# Patient Record
Sex: Female | Born: 1990 | Race: Black or African American | Hispanic: No | Marital: Single | Smoking: Current every day smoker
Health system: Southern US, Community
[De-identification: ages and names within clinical notes are randomized; demographics above are authoritative.]

---

## 2012-06-04 ENCOUNTER — Emergency Department (HOSPITAL_COMMUNITY): Payer: No Typology Code available for payment source

## 2012-06-04 ENCOUNTER — Emergency Department (HOSPITAL_COMMUNITY)
Admission: EM | Admit: 2012-06-04 | Discharge: 2012-06-04 | Disposition: A | Discharge status: Home / Self Care | Payer: No Typology Code available for payment source | Attending: Emergency Medicine | Admitting: Emergency Medicine

## 2012-06-04 DIAGNOSIS — Y92410 Unspecified street and highway as the place of occurrence of the external cause: Secondary | ICD-10-CM | POA: Insufficient documentation

## 2012-06-04 DIAGNOSIS — S46909A Unspecified injury of unspecified muscle, fascia and tendon at shoulder and upper arm level, unspecified arm, initial encounter: Secondary | ICD-10-CM | POA: Insufficient documentation

## 2012-06-04 DIAGNOSIS — Y9389 Activity, other specified: Secondary | ICD-10-CM | POA: Insufficient documentation

## 2012-06-04 DIAGNOSIS — IMO0002 Reserved for concepts with insufficient information to code with codable children: Secondary | ICD-10-CM | POA: Insufficient documentation

## 2012-06-04 MED ORDER — IBUPROFEN 800 MG PO TABS: 800.0000 mg | ORAL_TABLET | Freq: Three times a day (TID) | ORAL | Status: DC

## 2012-06-04 MED ORDER — CYCLOBENZAPRINE HCL 5 MG PO TABS: 5.0000 mg | ORAL_TABLET | Freq: Two times a day (BID) | ORAL | Status: DC | PRN
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
## 2012-06-08 NOTE — ED Provider Notes (Signed)
Medical screening examination/treatment/procedure(s) were performed by non-physician practitioner and as supervising physician I was immediately available for consultation/collaboration.  Christopher J. Pollina, MD 06/08/12 2339 

## 2012-10-18 ENCOUNTER — Emergency Department (HOSPITAL_COMMUNITY): Payer: Self-pay

## 2012-10-18 ENCOUNTER — Encounter (HOSPITAL_COMMUNITY): Payer: Self-pay | Admitting: *Deleted

## 2012-10-18 ENCOUNTER — Emergency Department (HOSPITAL_COMMUNITY)
Admission: EM | Admit: 2012-10-18 | Discharge: 2012-10-18 | Disposition: A | Discharge status: Home / Self Care | Payer: Self-pay | Attending: Emergency Medicine | Admitting: Emergency Medicine

## 2012-10-18 DIAGNOSIS — Z3202 Encounter for pregnancy test, result negative: Secondary | ICD-10-CM | POA: Insufficient documentation

## 2012-10-18 DIAGNOSIS — F17200 Nicotine dependence, unspecified, uncomplicated: Secondary | ICD-10-CM | POA: Insufficient documentation

## 2012-10-18 DIAGNOSIS — M25562 Pain in left knee: Secondary | ICD-10-CM

## 2012-10-18 DIAGNOSIS — M25569 Pain in unspecified knee: Secondary | ICD-10-CM | POA: Insufficient documentation

## 2012-10-18 DIAGNOSIS — Z9181 History of falling: Secondary | ICD-10-CM | POA: Insufficient documentation

## 2012-10-18 LAB — URINALYSIS, ROUTINE W REFLEX MICROSCOPIC
Bilirubin Urine: NEGATIVE
Glucose, UA: NEGATIVE mg/dL
Hgb urine dipstick: NEGATIVE
Protein, ur: NEGATIVE mg/dL
Specific Gravity, Urine: 1.026 (ref 1.005–1.030)
Urobilinogen, UA: 1 mg/dL (ref 0.0–1.0)

## 2012-10-18 LAB — URINE MICROSCOPIC-ADD ON

## 2012-10-18 MED ORDER — OXYCODONE HCL 5 MG PO TABS
5.0000 mg | ORAL_TABLET | Freq: Once | ORAL | Status: AC
  Administered 2012-10-18: 5 mg via ORAL
  Filled 2012-10-18: qty 1

## 2012-10-18 NOTE — ED Provider Notes (Signed)
CSN: [id number]     Arrival date & time 10/18/12  1955 History     First MD Initiated Contact with Patient 10/18/12 2014     Chief Complaint  Patient presents with  . Knee Pain   (Consider location/radiation/quality/duration/timing/severity/associated sxs/prior Treatment) HPI Comments: Patient presents emergency department with chief complaint of left-sided knee pain x2 weeks. She she states that she thinks she has "arthritis." She states that she slipped and fell about 2 months ago and has had intermittent pain since the fall. She is able to ambulate. She states the pain is moderate. Additionally, she is complaining of a foul odor to her urine, but no hematuria or dysuria. She denies any other pain or symptoms.  The history is provided by the patient. No language interpreter was used.    History reviewed. No pertinent past medical history. History reviewed. No pertinent past surgical history. No family history on file. History  Substance Use Topics  . Smoking status: Current Every Day Smoker  . Smokeless tobacco: Not on file  . Alcohol Use: Yes   OB History   Grav Para Term Preterm Abortions TAB SAB Ect Mult Living                 Review of Systems  All other systems reviewed and are negative.    Allergies  Tramadol and Tylenol  Home Medications   Current Outpatient Rx  Name  Route  Sig  Dispense  Refill  . ibuprofen (ADVIL,MOTRIN) 800 MG tablet   Oral   Take 1 tablet (800 mg total) by mouth 3 (three) times daily.   21 tablet   0    BP 138/71  Pulse 86  Temp(Src) 98.9 F (37.2 C)  Resp 18  SpO2 99%  LMP 09/24/2012 Physical Exam  Nursing note and vitals reviewed. Constitutional: She is oriented to person, place, and time. She appears well-developed and well-nourished.  HENT:  Head: Normocephalic and atraumatic.  Eyes: Conjunctivae and EOM are normal. Pupils are equal, round, and reactive to light.  Neck: Normal range of motion. Neck supple.   Cardiovascular: Normal rate and regular rhythm.  Exam reveals no gallop and no friction rub.   No murmur heard. Pulmonary/Chest: Effort normal and breath sounds normal. No respiratory distress. She has no wheezes. She has no rales. She exhibits no tenderness.  Abdominal: Soft. Bowel sounds are normal. She exhibits no distension and no mass. There is no tenderness. There is no rebound and no guarding.  Musculoskeletal: Normal range of motion. She exhibits no edema and no tenderness.  Left-sided knee tenderness to palpation, no bony abnormality or deformity, no swelling,  Neurological: She is alert and oriented to person, place, and time.  Skin: Skin is warm and dry.  Psychiatric: She has a normal mood and affect. Her behavior is normal. Judgment and thought content normal.    ED Course   Procedures (including critical care time)  Labs Reviewed  URINALYSIS, ROUTINE W REFLEX MICROSCOPIC - Abnormal; Notable for the following:    APPearance CLOUDY (*)    Leukocytes, UA MODERATE (*)    All other components within normal limits  URINE MICROSCOPIC-ADD ON - Abnormal; Notable for the following:    Squamous Epithelial / LPF FEW (*)    Bacteria, UA FEW (*)    All other components within normal limits  URINE CULTURE  PREGNANCY, URINE   Dg Knee Complete 4 Views Left  10/18/2012   *RADIOLOGY REPORT*  Clinical Data: Left knee pain  for 2 weeks.  No injury.  LEFT KNEE - COMPLETE 4+ VIEW  Comparison: None.  Findings: Anatomic alignment of the left knee.  There is no fracture.  Medial and lateral joint spaces are preserved.  No effusion.  IMPRESSION: Negative.   Original Report Authenticated By: Andreas Newport, M.D.   1. Knee pain, left     MDM  Patient with knee pain, but negative plain films. Will give knee sleeve, and recommend orthopedic followup. Urinalysis is clean. Recommend hydration, and cranberry juice. She denies any dysuria or hematuria.  Roxy Horseman, PA-C 10/18/12 2337

## 2012-10-18 NOTE — ED Notes (Signed)
The pt has pain in her lt knee for 2 weeks.  She also wants her urine checked because it has a strong odor.  lmp  July 1st

## 2012-10-19 NOTE — ED Provider Notes (Signed)
Medical screening examination/treatment/procedure(s) were performed by non-physician practitioner and as supervising physician I was immediately available for consultation/collaboration.  Derwood Kaplan, MD 10/19/12 4098

## 2012-10-21 LAB — URINE CULTURE

## 2012-10-22 ENCOUNTER — Telehealth (HOSPITAL_COMMUNITY): Payer: Self-pay | Admitting: *Deleted

## 2012-10-22 NOTE — ED Notes (Signed)
Post ED Visit - Positive Culture Follow-up: Successful Patient Follow-Up  Culture assessed and recommendations reviewed by: []  Wes Dulaney, Pharm.D., BCPS []  Celedonio Miyamoto, Pharm.D., BCPS []  Georgina Pillion, Pharm.D., BCPS []  Star, [address].D., BCPS, AAHIVP [x]  Estella Husk, Pharm.D., BCPS, AAHIVP  Positive Urine culture  []  Patient discharged without antimicrobial prescription and treatment is now indicated [x]  Organism is resistant to prescribed ED discharge antimicrobial []  Patient with positive blood cultures  Changes discussed with ED provider:Josh Geiple New antibiotic prescription Keflex 500 mg TID x 7 days    Larena Sox 10/22/2012, 11:09 AM

## 2012-10-22 NOTE — Progress Notes (Addendum)
ED Antimicrobial Stewardship Positive Culture Follow Up   Joanne Price is an 22 y.o. female who presented to [facility] on 10/18/2012 with a chief complaint of knee pain and malodorus urine.  Chief Complaint  Patient presents with  . Knee Pain    Recent Results (from the past 720 hour(s))  URINE CULTURE     Status: None   Collection Time    10/18/12  8:13 PM      Result Value Range Status   Specimen Description URINE, CLEAN CATCH   Final   Special Requests NONE   Final   Culture  Setup Time 10/19/2012 02:18   Final   Colony Count >=100,000 COLONIES/ML   Final   Culture ESCHERICHIA COLI   Final   Report Status 10/21/2012 FINAL   Final   Organism ID, Bacteria ESCHERICHIA COLI   Final    [x]  Patient discharged originally without antimicrobial agent and treatment is now indicated  New antibiotic prescription: Keflex 500mg  PO TID x 7 days  ED Provider: Rhea Bleacher, PA-C  Sallee Provencal 10/22/2012, 10:22 AM Infectious Diseases Pharmacist Phone# [phone]

## 2012-10-24 ENCOUNTER — Telehealth (HOSPITAL_COMMUNITY): Payer: Self-pay | Admitting: Emergency Medicine

## 2012-10-25 ENCOUNTER — Telehealth (HOSPITAL_COMMUNITY): Payer: Self-pay | Admitting: Emergency Medicine

## 2012-10-25 NOTE — ED Notes (Signed)
Unable to contact patient via phone. Sent letter. °

## 2013-01-05 ENCOUNTER — Emergency Department (HOSPITAL_COMMUNITY)
Admission: EM | Admit: 2013-01-05 | Discharge: 2013-01-05 | Disposition: A | Discharge status: Home / Self Care | Payer: Self-pay | Attending: Emergency Medicine | Admitting: Emergency Medicine

## 2013-01-05 ENCOUNTER — Encounter (HOSPITAL_COMMUNITY): Payer: Self-pay | Admitting: Emergency Medicine

## 2013-01-05 DIAGNOSIS — Z888 Allergy status to other drugs, medicaments and biological substances status: Secondary | ICD-10-CM | POA: Insufficient documentation

## 2013-01-05 DIAGNOSIS — F17200 Nicotine dependence, unspecified, uncomplicated: Secondary | ICD-10-CM | POA: Insufficient documentation

## 2013-01-05 DIAGNOSIS — N898 Other specified noninflammatory disorders of vagina: Secondary | ICD-10-CM | POA: Insufficient documentation

## 2013-01-05 DIAGNOSIS — K089 Disorder of teeth and supporting structures, unspecified: Secondary | ICD-10-CM | POA: Insufficient documentation

## 2013-01-05 DIAGNOSIS — N390 Urinary tract infection, site not specified: Secondary | ICD-10-CM | POA: Insufficient documentation

## 2013-01-05 LAB — URINALYSIS, ROUTINE W REFLEX MICROSCOPIC
Glucose, UA: NEGATIVE mg/dL
Hgb urine dipstick: NEGATIVE
Protein, ur: NEGATIVE mg/dL
pH: 5.5 (ref 5.0–8.0)

## 2013-01-05 LAB — WET PREP, GENITAL
Trich, Wet Prep: NONE SEEN
Yeast Wet Prep HPF POC: NONE SEEN

## 2013-01-05 LAB — URINE MICROSCOPIC-ADD ON

## 2013-01-05 MED ORDER — NAPROXEN 500 MG PO TABS: 500.0000 mg | ORAL_TABLET | Freq: Two times a day (BID) | ORAL | Status: DC

## 2013-01-05 MED ORDER — METRONIDAZOLE 500 MG PO TABS: 500.0000 mg | ORAL_TABLET | Freq: Two times a day (BID) | ORAL | Status: AC

## 2013-01-05 MED ORDER — NAPROXEN 250 MG PO TABS
500.0000 mg | ORAL_TABLET | Freq: Once | ORAL | Status: DC
  Filled 2013-01-05: qty 2

## 2013-01-05 MED ORDER — CIPROFLOXACIN HCL 250 MG PO TABS: 250.0000 mg | ORAL_TABLET | Freq: Two times a day (BID) | ORAL | Status: AC

## 2013-01-05 NOTE — ED Provider Notes (Signed)
CSN: [id number]     Arrival date & time 01/05/13  4010 History   First MD Initiated Contact with Patient 01/05/13 1131     Chief Complaint  Patient presents with  . Vaginal Discharge  . Dental Pain   (Consider location/radiation/quality/duration/timing/severity/associated sxs/prior Treatment) Patient is a 22 y.o. female presenting with vaginal discharge and tooth pain. The history is provided by the patient. No language interpreter was used.  Vaginal Discharge Quality:  Wallace Cullens Severity:  Mild Duration:  2 days Timing:  Intermittent Ineffective treatments:  None tried Associated symptoms: no dysuria, no fever, no genital lesions, no nausea, no rash, no vaginal itching and no vomiting   Risk factors: no new sexual partner   Dental Pain Location:  Lower Quality:  Aching and constant Severity:  Mild Duration:  7 days Context: not abscess, not dental fracture and not malocclusion   Associated symptoms: no difficulty swallowing, no facial pain, no fever, no gum swelling, no neck swelling, no oral bleeding and no oral lesions    Patient is a 22 year old female who presents today with vaginal discharge and a strong odor in her urine. She also reports dental pain on the right lower side. She reports that she does not have any dysuria, hematuria or other urinary symptoms. She reports feeling a little pressure in her lower pelvic area and has a milky white vaginal discharge with odor. She denies fever, chills and recent illness. She reports that she has been with her current partner for 2 years and has not recently been tested for STD. She reports that he had a recent infection which she believes to be a UTI. She denies pain with intercourse. She denies vaginal itching, lesions, nausea and vomiting.   History reviewed. No pertinent past medical history. History reviewed. No pertinent past surgical history. History reviewed. No pertinent family history. History  Substance Use Topics  . Smoking  status: Current Every Day Smoker  . Smokeless tobacco: Not on file  . Alcohol Use: Yes   OB History   Grav Para Term Preterm Abortions TAB SAB Ect Mult Living                 Review of Systems  Constitutional: Negative for fever.  HENT: Negative for mouth sores.   Gastrointestinal: Negative for nausea and vomiting.  Genitourinary: Positive for vaginal discharge. Negative for dysuria and vaginal pain.  All other systems reviewed and are negative.    Allergies  Tramadol and Tylenol  Home Medications   Current Outpatient Rx  Name  Route  Sig  Dispense  Refill  . ibuprofen (ADVIL,MOTRIN) 800 MG tablet   Oral   Take 1 tablet (800 mg total) by mouth 3 (three) times daily.   21 tablet   0    BP 132/76  Pulse 107  Temp(Src) 98.3 F (36.8 C) (Oral)  Resp 18  Ht 5\' 9"  (1.753 m)  Wt 174 lb (78.926 kg)  BMI 25.68 kg/m2  SpO2 100% Physical Exam  Nursing note and vitals reviewed. Constitutional: She is oriented to person, place, and time. She appears well-developed and well-nourished. No distress.  HENT:  Head: Normocephalic and atraumatic.  Mouth/Throat: Oropharynx is clear and moist.  Eyes: Pupils are equal, round, and reactive to light.  Neck: Normal range of motion. Neck supple. No thyromegaly present.  Cardiovascular: Normal rate, regular rhythm, normal heart sounds and intact distal pulses.   Pulmonary/Chest: Effort normal and breath sounds normal. No respiratory distress. She exhibits no tenderness.  Abdominal: Soft. Bowel sounds are normal. She exhibits no distension and no mass. There is no tenderness. There is no rebound and no guarding.  Genitourinary: Uterus normal. Pelvic exam was performed with patient prone. No labial fusion. There is no rash, tenderness, lesion or injury on the right labia. There is no rash, tenderness, lesion or injury on the left labia. Uterus is not tender. Cervix exhibits discharge. Cervix exhibits no motion tenderness. Right adnexum displays  no mass, no tenderness and no fullness. Left adnexum displays no mass, no tenderness and no fullness. Vaginal discharge found.  Milky, white vaginal discharge. Mild odor.  Musculoskeletal: Normal range of motion.  Lymphadenopathy:    She has no cervical adenopathy.  Neurological: She is alert and oriented to person, place, and time.  Skin: Skin is warm and dry.  Psychiatric: She has a normal mood and affect. Her behavior is normal. Judgment and thought content normal.    ED Course  Procedures (including critical care time) Labs Review Labs Reviewed  URINALYSIS, ROUTINE W REFLEX MICROSCOPIC - Abnormal; Notable for the following:    Color, Urine AMBER (*)    APPearance CLOUDY (*)    Specific Gravity, Urine 1.037 (*)    Bilirubin Urine SMALL (*)    Nitrite POSITIVE (*)    Leukocytes, UA SMALL (*)    All other components within normal limits  URINE MICROSCOPIC-ADD ON - Abnormal; Notable for the following:    Squamous Epithelial / LPF FEW (*)    Bacteria, UA MANY (*)    All other components within normal limits  URINE CULTURE   Imaging Review No results found.  EKG Interpretation   None       MDM   1. UTI (lower urinary tract infection)   2. Vaginal discharge    Wet prep significant for moderate clue cells.Urinalysis; +nitrites and leukocytes. GC/Chlamydia cultures sent, will wait culture results. Metronidazole 500mg  BID x 7d for BV and Cipro 250mg  BID 3 3d for UTI. Return if fever, chills or increasing abdominal pain. Left, lower molar pain no inflammation of signs of abscess, more likely overcrowding of teeth due to wisdom teeth. Follow-up with dentist for referral to oral surgeon.      Irish Elders, NP 01/05/13 [phone]

## 2013-01-05 NOTE — ED Notes (Signed)
Pt c/o cloudy vaginal discharge with odor and strong urine odor; pt sts right lower dental pain

## 2013-01-05 NOTE — ED Provider Notes (Signed)
Medical screening examination/treatment/procedure(s) were performed by non-physician practitioner and as supervising physician I was immediately available for consultation/collaboration.  Flint Melter, MD 01/05/13 2147

## 2013-01-06 LAB — GC/CHLAMYDIA PROBE AMP
CT Probe RNA: NEGATIVE
GC Probe RNA: NEGATIVE

## 2013-01-07 LAB — URINE CULTURE: Colony Count: >=100000

## 2013-09-07 IMAGING — CR DG KNEE COMPLETE 4+V*L*
4 series · 4 of 4 positions shown · non-contrast
Comparison: None.

CLINICAL DATA: Left knee pain for 2 weeks.  No injury.

LEFT KNEE - COMPLETE 4+ VIEW

[x knee ap left]
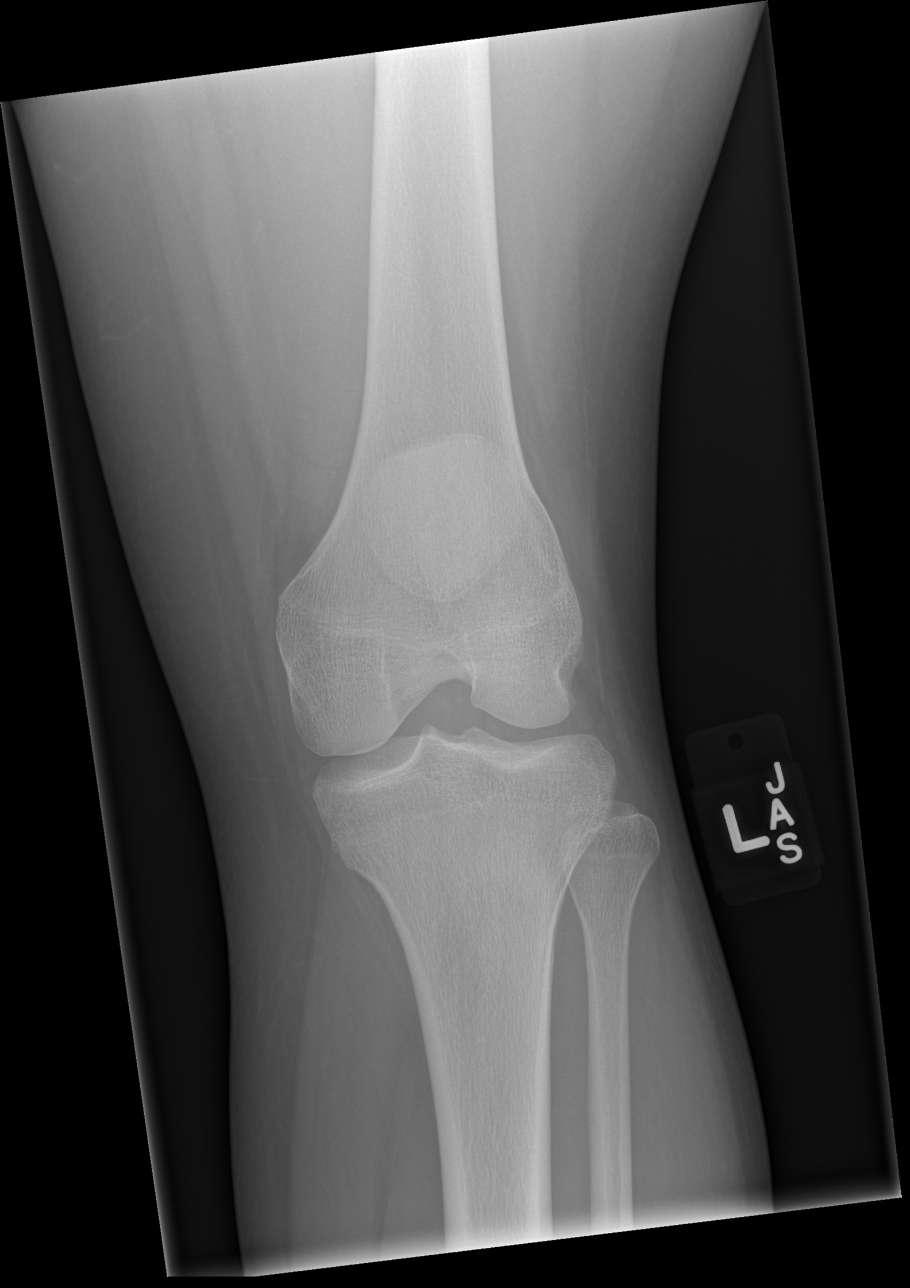

[x knee obl left (1 of 2)]
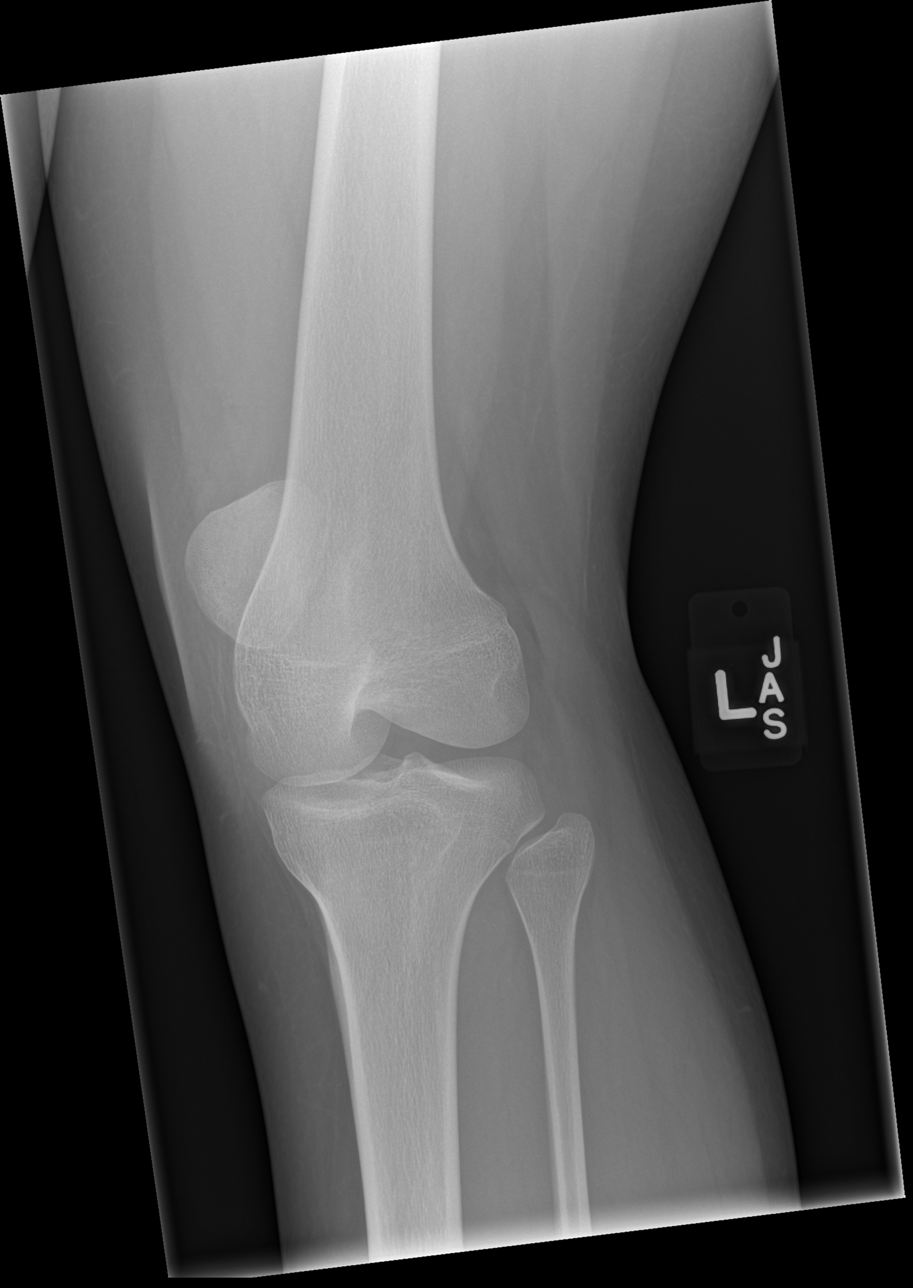

[x knee obl left (2 of 2)]
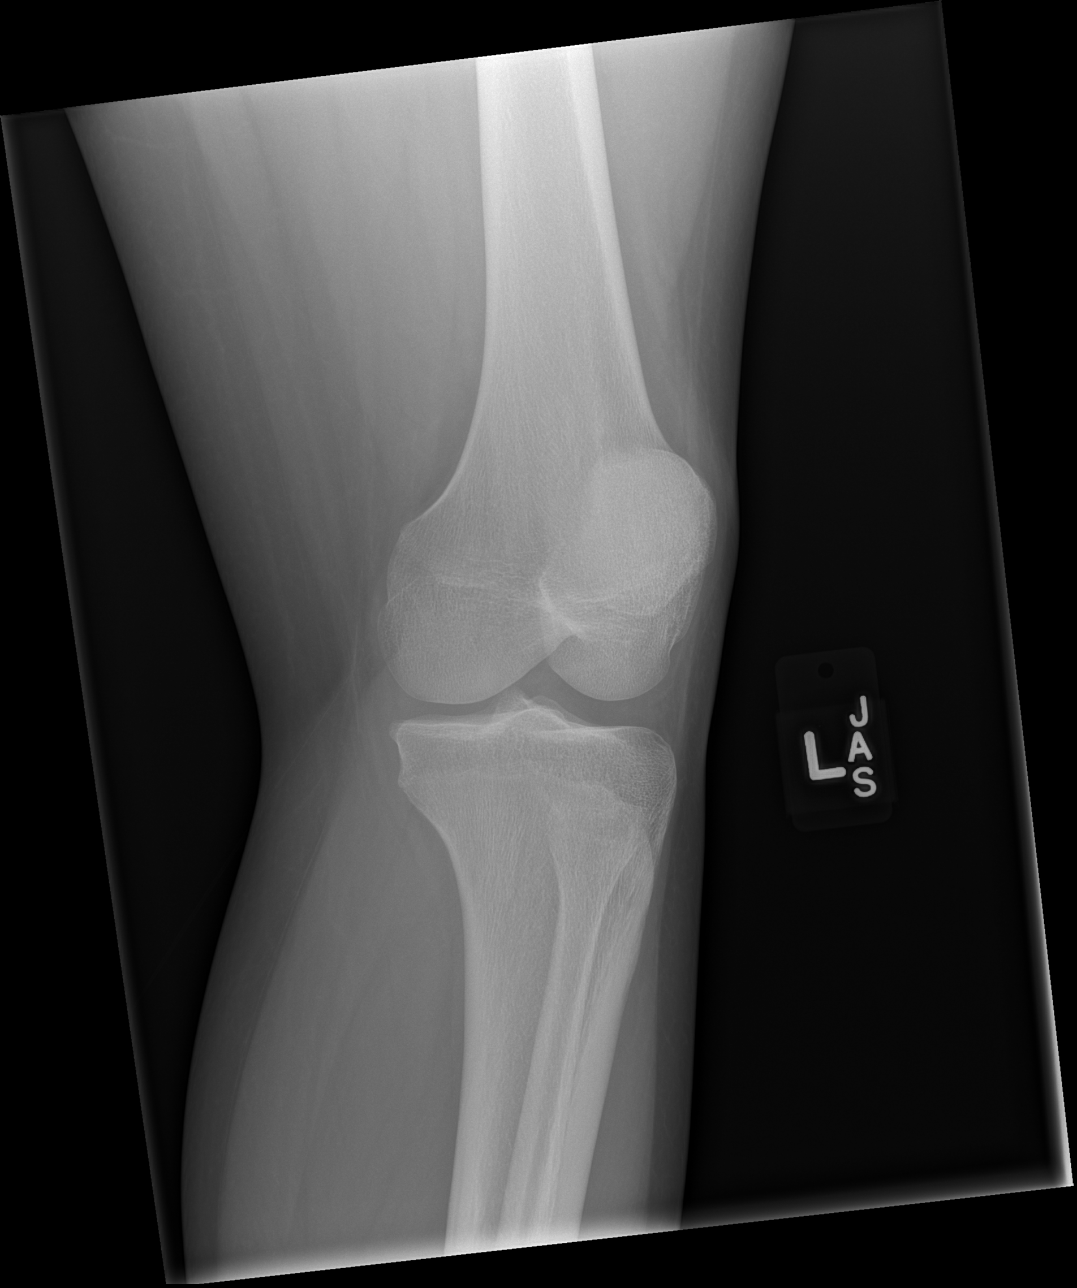

[x knee lat left]
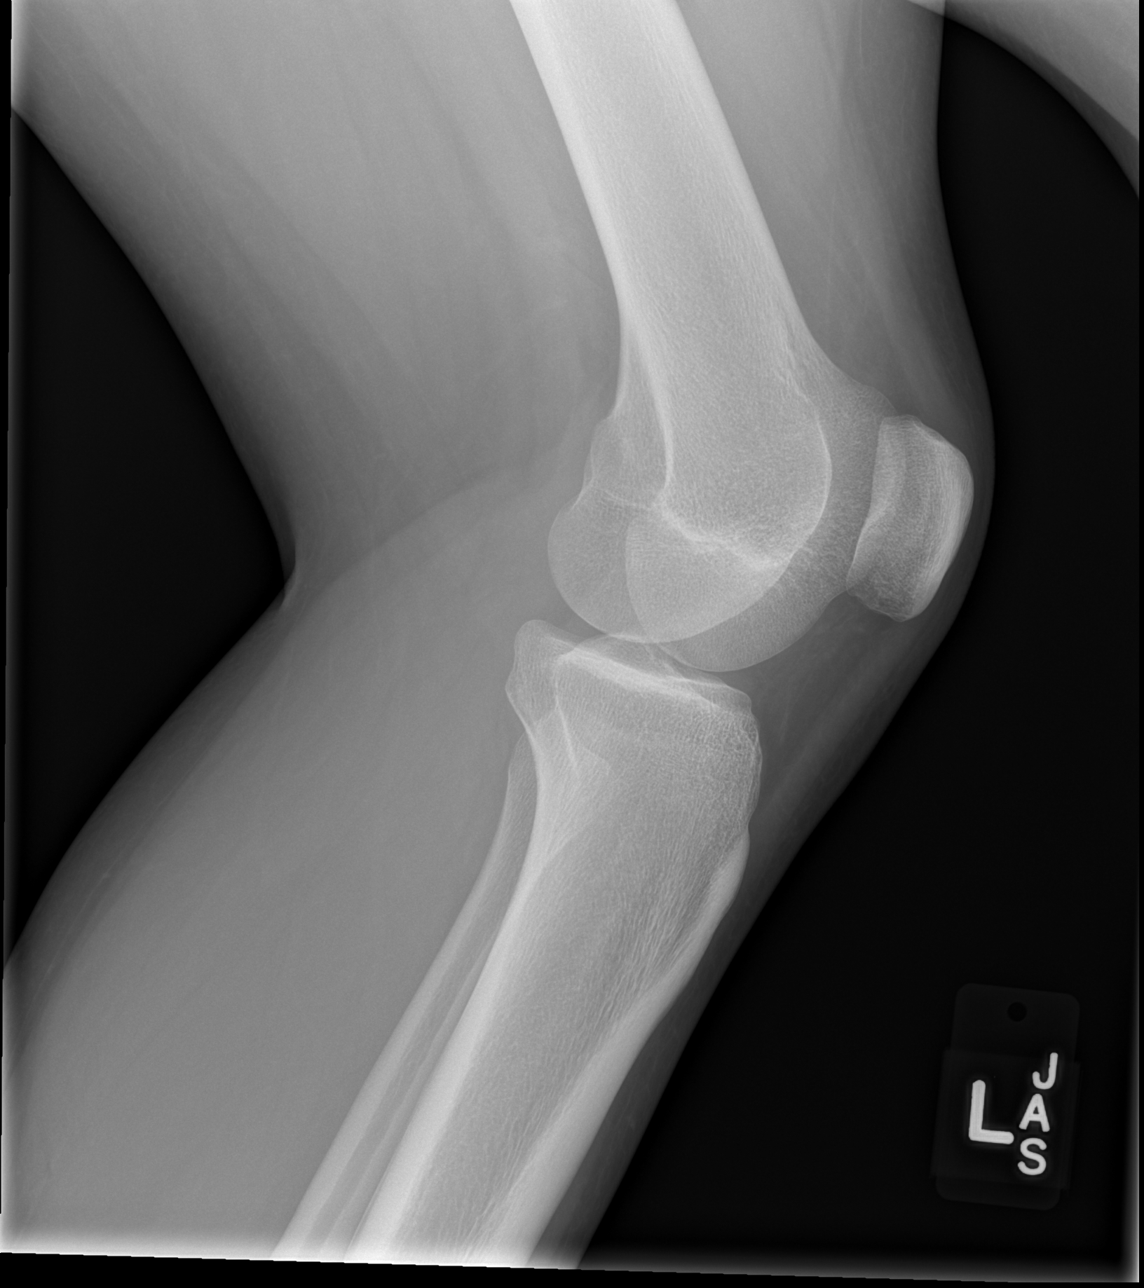

[4 of 4 positions shown; findings below may reference images not displayed]

FINDINGS: Anatomic alignment of the left knee.  There is no
fracture.  Medial and lateral joint spaces are preserved.  No
effusion.
IMPRESSION: Negative.

## 2014-08-22 ENCOUNTER — Encounter (HOSPITAL_COMMUNITY): Payer: Self-pay | Admitting: *Deleted

## 2014-08-22 ENCOUNTER — Emergency Department (HOSPITAL_COMMUNITY)
Admission: EM | Admit: 2014-08-22 | Discharge: 2014-08-22 | Disposition: A | Discharge status: Home / Self Care | Payer: Self-pay | Attending: Emergency Medicine | Admitting: Emergency Medicine

## 2014-08-22 DIAGNOSIS — M545 Low back pain: Secondary | ICD-10-CM

## 2014-08-22 DIAGNOSIS — Y9301 Activity, walking, marching and hiking: Secondary | ICD-10-CM | POA: Insufficient documentation

## 2014-08-22 DIAGNOSIS — W1839XA Other fall on same level, initial encounter: Secondary | ICD-10-CM | POA: Insufficient documentation

## 2014-08-22 DIAGNOSIS — S3992XA Unspecified injury of lower back, initial encounter: Secondary | ICD-10-CM | POA: Insufficient documentation

## 2014-08-22 DIAGNOSIS — Y998 Other external cause status: Secondary | ICD-10-CM | POA: Insufficient documentation

## 2014-08-22 DIAGNOSIS — Y9289 Other specified places as the place of occurrence of the external cause: Secondary | ICD-10-CM | POA: Insufficient documentation

## 2014-08-22 DIAGNOSIS — Z720 Tobacco use: Secondary | ICD-10-CM | POA: Insufficient documentation

## 2014-08-22 MED ORDER — METHOCARBAMOL 500 MG PO TABS: 500.0000 mg | ORAL_TABLET | Freq: Four times a day (QID) | ORAL | Status: AC | PRN

## 2014-08-22 NOTE — Discharge Instructions (Signed)
Read the information below.  Use the prescribed medication as directed.  Please discuss all new medications with your pharmacist.  You may return to the Emergency Department at any time for worsening condition or any new symptoms that concern you.    If you develop fevers, loss of control of bowel or bladder, weakness or numbness in your legs, or are unable to walk, return to the ER for a recheck.    Back Pain, Adult Back pain is very common. The pain often gets better over time. The cause of back pain is usually not dangerous. Most people can learn to manage their back pain on their own.  HOME CARE   Stay active. Start with short walks on flat ground if you can. Try to walk farther each day.  Do not sit, drive, or stand in one place for more than 30 minutes. Do not stay in bed.  Do not avoid exercise or work. Activity can help your back heal faster.  Be careful when you bend or lift an object. Bend at your knees, keep the object close to you, and do not twist.  Sleep on a firm mattress. Lie on your side, and bend your knees. If you lie on your back, put a pillow under your knees.  Only take medicines as told by your doctor.  Put ice on the injured area.  Put ice in a plastic bag.  Place a towel between your skin and the bag.  Leave the ice on for 15-20 minutes, 03-04 times a day for the first 2 to 3 days. After that, you can switch between ice and heat packs.  Ask your doctor about back exercises or massage.  Avoid feeling anxious or stressed. Find good ways to deal with stress, such as exercise. GET HELP RIGHT AWAY IF:   Your pain does not go away with rest or medicine.  Your pain does not go away in 1 week.  You have new problems.  You do not feel well.  The pain spreads into your legs.  You cannot control when you poop (bowel movement) or pee (urinate).  Your arms or legs feel weak or lose feeling (numbness).  You feel sick to your stomach (nauseous) or throw up  (vomit).  You have belly (abdominal) pain.  You feel like you may pass out (faint). MAKE SURE YOU:   Understand these instructions.  Will watch your condition.  Will get help right away if you are not doing well or get worse. Document Released: 08/28/2007 Document Revised: 06/03/2011 Document Reviewed: 07/13/2013 [facility] Patient Information 2015 [place], [place]. This information is not intended to replace advice given to you by your health care provider. Make sure you discuss any questions you have with your health care provider.    Emergency Department Resource Guide 1) Find a Doctor and Pay Out of Pocket Although you won't have to find out who is covered by your insurance plan, it is a good idea to ask around and get recommendations. You will then need to call the office and see if the doctor you have chosen will accept you as a new patient and what types of options they offer for patients who are self-pay. Some doctors offer discounts or will set up payment plans for their patients who do not have insurance, but you will need to ask so you aren't surprised when you get to your appointment.  2) Contact Your Local Health Department Not all health departments have doctors that can see  patients for sick visits, but many do, so it is worth a call to see if yours does. If you don't know where your local health department is, you can check in your phone book. The CDC also has a tool to help you locate your state's health department, and many state websites also have listings of all of their local health departments.  3) Find a Walk-in Clinic If your illness is not likely to be very severe or complicated, you may want to try a walk in clinic. These are popping up all over the country in pharmacies, drugstores, and shopping centers. They're usually staffed by nurse practitioners or physician assistants that have been trained to treat common illnesses and complaints. They're usually fairly quick  and inexpensive. However, if you have serious medical issues or chronic medical problems, these are probably not your best option.  No Primary Care Doctor: - Call Health Connect at  [phone] - they can help you locate a primary care doctor that  accepts your insurance, provides certain services, etc. - Physician Referral Service- [phone]  Chronic Pain Problems: Organization         Address  Phone   Notes  [facility]  [phone] Patients need to be referred by their primary care doctor.   Medication Assistance: Organization         Address  Phone   Notes  [facility] Medication [facility] [address] [place] [place], [place] [address] [phone] --Must be a resident of [facility] -- Must have NO insurance coverage whatsoever (no Medicaid/ Medicare, etc.) -- The pt. MUST have a primary care doctor that directs their care regularly and follows them in the community   MedAssist  [phone]   [employer]  [phone]    Agencies that provide inexpensive medical care: Organization         Address  Phone   Notes  [facility]  [phone]   [facility]    [phone]   [facility] [address] [place], [place] [address] [phone]   [facility] [address] [place] [place] [phone]   Planned Parenthood    [phone]   [facility]    [phone]   [facility] and [facility]  [address], [place] Phone:  [phone], Fax:  [phone] Hours of Operation:  9 am - 6 pm, M-F.  Also accepts Medicaid/Medicare and self-pay.  [facility]  [address], [place] Phone: [phone], Fax: [phone]. Hours of Operation:  8:30 am - 5:30 pm, M-F.  Also accepts Medicaid and self-pay.  [facility] [place] [address], [place] Phone: [phone]    [facility] [address] Natasha Bence [place], [place] [phone], Ext. [phone] Mondays & Thursdays: 7-9 AM.  First 15 patients are seen on a first come, first serve basis.    Medicaid-accepting [facility] Providers:  Organization         Address  Phone   Notes  [facility] [address],  [phone] Also accepts self-pay patients.  [facility] [address] Laurell Josephs [place], [place]  [phone]   [facility] [address], [place] [phone]   [facility] [address] [place] (  [phone]) [phone]   Renaye RakersVeita Bland [address], [place]   [phone](336) [phone] Only accepts [place] Access [place] patients after they have their name applied to their card.   Self-Pay (no insurance) in [facility]:  Organization         Address  Phone   Notes  Sickle Cell Patients, [facility] [address], [place] [phone]) [phone]   [facility] [address], [place] [phone]) [phone]   [facility] [place] [facility]  [address], [place] [phone]) [phone]   [facility]/Dr. Osei-Bonsu  [address] [place] [address], [place] or [phone], [place] [phone]) [phone] Phone number for both [place] and [place] locations is the same.  [facility] and [facility] [address], [place] [phone](336) [phone]   [facility] [address] [place] [address], [place] or [address] [phone]) [phone](336) [phone]   [facility] [address], [place] [phone](336) [phone], phone; [phone](336) [phone], fax Sees patients 1st and 3rd Saturday of every month.  Must not qualify for public or private insurance (i.e. Medicaid, Medicare, [place] Health Choice, Veterans' Benefits)  Household income should be no more than 200% of the poverty level The clinic cannot treat you if you are  pregnant or think you are pregnant  Sexually transmitted diseases are not treated at the clinic.    Dental Care: Organization         Address  Phone  Notes  [facility] [address], [place] [phone]) [phone] Accepts children up to age 25 who are enrolled in [place] or [place] Health Choice; pregnant women with a Medicaid card; and children who have applied for Medicaid or [place] Health Choice, but were declined, whose parents can pay a reduced fee at time of service.  [facility] [place]  [address], [place] [phone]) [phone] Accepts children up to age 25 who are enrolled in [place] or [place] Health Choice; pregnant women with a Medicaid card; and children who have applied for Medicaid or  Health Choice, but were declined, whose parents can pay a reduced fee at time of service.  [facility]  [address], [place] [phone](336) [phone] Patients are seen by appointment only. Walk-ins are not accepted. [facility] will see patients 25 years of age and older. Monday - Tuesday (8am-5pm) Most Wednesdays (8:30-5pm) $30 per visit, cash only  [facility]  [address], [facility] [phone](336) [phone] Patients are seen by appointment only. Walk-ins are not accepted. [facility] will see patients 25 years of age and older. One Wednesday Evening (Monthly: Volunteer Based).  $30 per visit, cash only  [employer] [facility]  [phone] for adults; Children under age 274, call Graduate Pediatric Dentistry at [phone]) [phone]. Children aged 304-14, please call [phone] to request a pediatric application.  Dental services are provided in all areas of dental care including fillings, crowns and bridges, complete and partial dentures, implants, gum treatment, root canals, and extractions. Preventive care is also provided. Treatment is provided to  both adults and children. Patients are selected via a lottery and there is often a waiting list.   [facility] [address], [place]  [phone]) [phone] [email or web address]   [facility] [address], [place]  [place], [place] [phone], Ext. [phone] Second and Fourth Thursday of each month, opens at 6:30 AM; Clinic ends at 9 AM.  Patients are seen on a first-come first-served basis, and a limited number are seen during each clinic.   [facility]  [address] Ether Griffins [place], [place] [phone]   Eligibility Requirements You must have lived in [place], [place], or [place] counties for at least the last three months.   You cannot be eligible for state or federal sponsored [place], including [employer], [place], or [employer].   You generally cannot be eligible for healthcare insurance through your employer.    How to apply: Eligibility screenings are held every Tuesday and Wednesday afternoon from 1:00 pm until 4:00 pm. You do not need an appointment for the interview!  [facility] [address], [place], [place] [phone]   [facility] Health Department  [phone]   [facility] Health Department  [phone]   [facility] Health Department  [phone]    Behavioral Health Resources in the Community: Intensive Outpatient Programs Organization         Address  Phone  Notes  [facility] [address] [place], [place] [phone]   [facility] [address] [place], [place] [phone]   [facility] [address], [place], [place]  [phone]   [facility] [address],  [place], [place] [phone] or [phone]   Substance Abuse Resources Organization         Address  Phone  Notes  [facility]  [phone]   [facility]  [phone]   [facility]   2237042924   Floydene Flock  [phone]   Residential & Outpatient Substance Abuse Program  [phone]   Psychological Services Organization         Address  Phone  Notes  [facility]  [phone]   [facility]  [phone]   [facility] [address], [place] [phone] or [phone]    Mobile Crisis Teams Organization         Address  Phone  Notes  [facility], Mobile Crisis Care Unit  [phone]   [facility]  [address] [place], [place] [phone]   Doristine Locks [address] [place] [phone]    Self-Help/Support Groups Organization         Address  Phone             Notes  [facility] [place] - variety of support groups  336- I7437963 Call for more information  Narcotics Anonymous (NA), [facility] [address], [employer] [place]  2 meetings at this location   Statistician         Address  Phone  Notes  [facility] [address],    [place]  [phone]   [facility]  [address] [place] [address]24097, [place], [place] [phone]   [facility] [address] [place], [place] [phone] Admissions: 8am-3pm M-F  [facility] [address],    [place], [place] [phone]   [facility] [address] Starling Manns [place], [place] [phone]   [facility] [address]  [place], [place] [phone]   [facility] - Intensive Outpatient [address]ure[address], [place], [place] [phone]   [facility] (  [facility] [address] [place]  [place], [place] [phone] or [phone]   [facility] ([facility]) [address], [place], [place] [phone] Accepts Medicaid  [facility] [address],  [place] [phone] Substance Abuse/Addiction Treatment   [facility] Organization         Address  Phone  Notes  [facility]  [phone]   Angie Fava, PhD [address] [place], [place]   [phone] or [phone]   [facility] Behavioral   [address] [place], [place] [phone]   [facility] [address], [place], [place] [phone] Insurance/Medicaid/sponsorship through [facility] and Families [address]                                    [place], [place] [phone] Therapy/tele-psych/case  [facility] [address], [place] [phone]    Dr. Lolly Mustache  [phone]   [facility]  [employer] [facility] Dept. 1) [address], [place] 2) [address], [place] 3)  [address], [place] [phone]  [phone]   [facility] Child Abuse Hotline [phone]) 872-7392 or [phone] (After Hours)

## 2014-08-22 NOTE — ED Notes (Signed)
Declined W/C at D/C and was escorted to lobby by RN. 

## 2014-08-22 NOTE — ED Provider Notes (Signed)
CSN: [id number]     Arrival date & time 08/22/14  1005 History  This chart was scribed for non-physician practitioner, Trixie DredgeEmily Herberto Ledwell, PA-C, working with Mancel BaleElliott Wentz, MD by Charline BillsEssence Howell, ED Scribe. This patient was seen in room TR07C/TR07C and the patient's care was started at 10:31 AM.   Chief Complaint  Patient presents with  . Back Pain   The history is provided by the patient. No language interpreter was used.   HPI Comments: Joanne Price is a 24 y.o. female, with no pertinent medical history, who presents to the Emergency Department complaining of intermittent lower back pain for the past 2 days. Pt states that she fell on her back from a standing position 2 days ago while intoxicated. She denies LOC or head trauma. Pt currently rates her pain 7/10 and describes pain as a non-radiating, sharp sensation. No medications tried PTA but she has tried soaking in a hot bath. Pt denies abdominal pain, chest pain, weakness, numbness, urinary or bowel incontinence, dysuria, urinary frequency, urinary urgency, vaginal discharge, constipation, blood in stools, diarrhea. LNMP 08/10/14.  History reviewed. No pertinent past medical history. History reviewed. No pertinent past surgical history. History reviewed. No pertinent family history. History  Substance Use Topics  . Smoking status: Current Every Day Smoker  . Smokeless tobacco: Never Used  . Alcohol Use: Yes   OB History    No data available     Review of Systems  Constitutional: Negative for fever and chills.  Cardiovascular: Negative for chest pain.  Gastrointestinal: Negative for abdominal pain, diarrhea, constipation and blood in stool.  Genitourinary: Negative for dysuria, urgency, frequency and vaginal discharge.  Musculoskeletal: Positive for back pain.  Skin: Negative for color change and wound.  Allergic/Immunologic: Negative for immunocompromised state.  Neurological: Negative for weakness and numbness.  Hematological: Does not  bruise/bleed easily.   Allergies  Flexeril; Ibuprofen; Tramadol; and Tylenol  Home Medications   Prior to Admission medications   Medication Sig Start Date End Date Taking? Authorizing Provider  ciprofloxacin (CIPRO) 250 MG tablet Take 1 tablet (250 mg total) by mouth 2 (two) times daily. 01/05/13   Irish EldersKelly Walker, NP  metroNIDAZOLE (FLAGYL) 500 MG tablet Take 1 tablet (500 mg total) by mouth 2 (two) times daily. 01/05/13   Irish EldersKelly Walker, NP  naproxen (NAPROSYN) 500 MG tablet Take 1 tablet (500 mg total) by mouth 2 (two) times daily. 01/05/13   Irish EldersKelly Walker, NP  oxycodone (OXY-IR) 5 MG capsule Take 5 mg by mouth every 6 (six) hours as needed for pain.    Historical Provider, MD   Triage: BP 157/103 mmHg  Pulse 64  Temp(Src) 98 F (36.7 C) (Oral)  Resp 14  SpO2 100%  LMP 08/10/2014 Physical Exam  Constitutional: She appears well-developed and well-nourished. No distress.  HENT:  Head: Normocephalic and atraumatic.  Neck: Neck supple.  Pulmonary/Chest: Effort normal.  Abdominal: Soft. She exhibits no distension and no mass. There is no tenderness. There is no rebound and no guarding.  Musculoskeletal:  Spine nontender, no crepitus, or stepoffs. Lower extremities:  Strength 5/5, sensation intact, distal pulses intact.     Neurological: She is alert. She exhibits normal muscle tone.  Skin: She is not diaphoretic.  Nursing note and vitals reviewed.  ED Course  Procedures (including critical care time) DIAGNOSTIC STUDIES: Oxygen Saturation is 100% on RA, normal by my interpretation.    COORDINATION OF CARE: 10:35 AM-Discussed treatment plan which includes Robaxin with pt at bedside and pt agreed  to plan.   Labs Review Labs Reviewed - No data to display  Imaging Review No results found.   EKG Interpretation None      MDM   Final diagnoses:  Low back pain without sciatica, unspecified back pain laterality    Afebrile, nontoxic patient with mechanical low back pain  after fall while intoxicated two days ago. No red flags.  NO signs of injury.  Neurovascularly intact.  D/C home with robaxin.  Discussed findings, treatment, and follow up  with patient.  Pt given return precautions.  Pt verbalizes understanding and agrees with plan.      I personally performed the services described in this documentation, which was scribed in my presence. The recorded information has been reviewed and is accurate.    Trixie Dredge, PA-C 08/22/14 1419  Mancel Bale, MD 08/22/14 [phone]

## 2014-08-22 NOTE — ED Notes (Signed)
Pt reports lower back pain since SAT. Marland Kitchen. Pt ambulatory to room today .Pt reports falling on flat ground while walking.

## 2014-09-03 ENCOUNTER — Encounter (HOSPITAL_COMMUNITY): Payer: Self-pay | Admitting: Emergency Medicine

## 2014-09-03 ENCOUNTER — Emergency Department (HOSPITAL_COMMUNITY)
Admission: EM | Admit: 2014-09-03 | Discharge: 2014-09-03 | Disposition: A | Discharge status: Home / Self Care | Payer: Self-pay | Attending: Emergency Medicine | Admitting: Emergency Medicine

## 2014-09-03 DIAGNOSIS — H9201 Otalgia, right ear: Secondary | ICD-10-CM | POA: Insufficient documentation

## 2014-09-03 DIAGNOSIS — K0889 Other specified disorders of teeth and supporting structures: Secondary | ICD-10-CM

## 2014-09-03 DIAGNOSIS — Z720 Tobacco use: Secondary | ICD-10-CM | POA: Insufficient documentation

## 2014-09-03 DIAGNOSIS — Z792 Long term (current) use of antibiotics: Secondary | ICD-10-CM | POA: Insufficient documentation

## 2014-09-03 DIAGNOSIS — K088 Other specified disorders of teeth and supporting structures: Secondary | ICD-10-CM | POA: Insufficient documentation

## 2014-09-03 MED ORDER — NAPROXEN 500 MG PO TABS: 500.0000 mg | ORAL_TABLET | Freq: Two times a day (BID) | ORAL | Status: AC

## 2014-09-03 NOTE — Discharge Instructions (Signed)
Take naproxen twice daily as prescribed. Follow-up with the dentist.  Dental Pain A tooth ache may be caused by cavities (tooth decay). Cavities expose the nerve of the tooth to air and hot or cold temperatures. It may come from an infection or abscess (also called a boil or furuncle) around your tooth. It is also often caused by dental caries (tooth decay). This causes the pain you are having. DIAGNOSIS  Your caregiver can diagnose this problem by exam. TREATMENT   If caused by an infection, it may be treated with medications which kill germs (antibiotics) and pain medications as prescribed by your caregiver. Take medications as directed.  Only take over-the-counter or prescription medicines for pain, discomfort, or fever as directed by your caregiver.  Whether the tooth ache today is caused by infection or dental disease, you should see your dentist as soon as possible for further care. SEEK MEDICAL CARE IF: The exam and treatment you received today has been provided on an emergency basis only. This is not a substitute for complete medical or dental care. If your problem worsens or new problems (symptoms) appear, and you are unable to meet with your dentist, call or return to this location. SEEK IMMEDIATE MEDICAL CARE IF:   You have a fever.  You develop redness and swelling of your face, jaw, or neck.  You are unable to open your mouth.  You have severe pain uncontrolled by pain medicine. MAKE SURE YOU:   Understand these instructions.  Will watch your condition.  Will get help right away if you are not doing well or get worse. Document Released: 03/11/2005 Document Revised: 06/03/2011 Document Reviewed: 10/28/2007 [facility] Patient Information 2015 [place], [place]. This information is not intended to replace advice given to you by your health care provider. Make sure you discuss any questions you have with your health care provider.

## 2014-09-03 NOTE — ED Provider Notes (Signed)
CSN: [id number]     Arrival date & time 09/03/14  1321 History  This chart was scribed for non-physician practitioner, Kathrynn Speed, PA-C, working with Toy Cookey, MD, by Ronney Lion, ED Scribe. This patient was seen in room WTR7/WTR7 and the patient's care was started at 1:51 PM.    Chief Complaint  Patient presents with  . Dental Pain   The history is provided by the patient. No language interpreter was used.    HPI Comments: Joanne Price is a 24 y.o. female who presents to the Emergency Department complaining of intermittent right lower dental pain that began 4 days ago. She states she feels like the pain may be due to her wisdom teeth, which have not yet erupted. Pain occasionally radiates to right ear. Patient states she doesn't have a dentist. Hot water alleviates the pain. Movement and chewing exacerbates the pain. She has tried Tylenol with no relief. She denies facial swelling or fever.    History reviewed. No pertinent past medical history. History reviewed. No pertinent past surgical history. History reviewed. No pertinent family history. History  Substance Use Topics  . Smoking status: Current Every Day Smoker  . Smokeless tobacco: Never Used  . Alcohol Use: Yes   OB History    No data available     Review of Systems  Constitutional: Negative for fever.  HENT: Positive for dental problem and ear pain. Negative for facial swelling.   All other systems reviewed and are negative.  Allergies  Flexeril; Ibuprofen; Tramadol; and Tylenol  Home Medications   Prior to Admission medications   Medication Sig Start Date End Date Taking? Authorizing Provider  ciprofloxacin (CIPRO) 250 MG tablet Take 1 tablet (250 mg total) by mouth 2 (two) times daily. 01/05/13   Irish Elders, NP  methocarbamol (ROBAXIN) 500 MG tablet Take 1 tablet (500 mg total) by mouth every 6 (six) hours as needed for muscle spasms (and pain). 08/22/14   Trixie Dredge, PA-C  metroNIDAZOLE (FLAGYL) 500 MG  tablet Take 1 tablet (500 mg total) by mouth 2 (two) times daily. 01/05/13   Irish Elders, NP  naproxen (NAPROSYN) 500 MG tablet Take 1 tablet (500 mg total) by mouth 2 (two) times daily. 09/03/14   Shakeena Kafer M Chaselyn Nanney, PA-C  oxycodone (OXY-IR) 5 MG capsule Take 5 mg by mouth every 6 (six) hours as needed for pain.    Historical Provider, MD   BP 137/73 mmHg  Pulse 83  Temp(Src) 99.4 F (37.4 C) (Oral)  Resp 16  SpO2 98%  LMP 08/10/2014 Physical Exam  Constitutional: She is oriented to person, place, and time. She appears well-developed and well-nourished. No distress.  HENT:  Head: Normocephalic and atraumatic.  Mouth/Throat: Oropharynx is clear and moist.    Wisdom tooth on R lower not yet erupted. Tender in that area. No erythema or swelling. No dental abscess.  Eyes: Conjunctivae and EOM are normal.  Neck: Normal range of motion. Neck supple.  Cardiovascular: Normal rate, regular rhythm and normal heart sounds.   Pulmonary/Chest: Effort normal and breath sounds normal. No respiratory distress.  Musculoskeletal: Normal range of motion. She exhibits no edema.  Neurological: She is alert and oriented to person, place, and time. No sensory deficit.  Skin: Skin is warm and dry.  Psychiatric: She has a normal mood and affect. Her behavior is normal.  Nursing note and vitals reviewed.   ED Course  Procedures (including critical care time)  DIAGNOSTIC STUDIES: Oxygen Saturation is 98% on RA,  normal by my interpretation.    COORDINATION OF CARE: 1:51 PM - Do not see signs of infection at this time. Discussed treatment plan with pt at bedside which includes anti-inflammatory medication and referral to dentist, and pt agreed to plan.  MDM   Final diagnoses:  Pain, dental   The pain is most likely from third molar erupting. No visible tooth. No signs of infection, abscess or Ludwig angina. Advised NSAIDs and follow-up with dentist. Resources given. Stable for discharge. Return precautions  given. Patient states understanding of treatment care plan and is agreeable.  I personally performed the services described in this documentation, which was scribed in my presence. The recorded information has been reviewed and is accurate.  Kathrynn Speed, PA-C 09/03/14 1407  Toy Cookey, MD 09/04/14 1014

## 2014-09-03 NOTE — ED Notes (Signed)
Pt reports R lower dental pain since tuesday

## 2016-07-27 ENCOUNTER — Emergency Department (HOSPITAL_COMMUNITY): Admission: EM | Admit: 2016-07-27 | Discharge: 2016-07-27 | Discharge status: Against Medical Advice | Payer: Self-pay

## 2016-07-27 NOTE — ED Triage Notes (Signed)
Pt called for triage x3, no response.
